# Patient Record
Sex: Female | Born: 1985 | Race: White | Hispanic: No | Marital: Single | Smoking: Current every day smoker
Health system: Southern US, Community
[De-identification: ages and names within clinical notes are randomized; demographics above are authoritative.]

## PROBLEM LIST (undated history)

## (undated) DIAGNOSIS — F418 Other specified anxiety disorders: Secondary | ICD-10-CM

## (undated) DIAGNOSIS — K458 Other specified abdominal hernia without obstruction or gangrene: Secondary | ICD-10-CM

## (undated) HISTORY — PX: TUBAL LIGATION: SHX77

## (undated) HISTORY — PX: ABDOMINAL HYSTERECTOMY: SHX81

---

## 2010-08-25 ENCOUNTER — Emergency Department (HOSPITAL_BASED_OUTPATIENT_CLINIC_OR_DEPARTMENT_OTHER)
Admission: EM | Admit: 2010-08-25 | Discharge: 2010-08-26 | Payer: Self-pay | Source: Home / Self Care | Admitting: Emergency Medicine

## 2010-11-14 LAB — URINE MICROSCOPIC-ADD ON

## 2010-11-14 LAB — URINALYSIS, ROUTINE W REFLEX MICROSCOPIC
Bilirubin Urine: NEGATIVE
Ketones, ur: NEGATIVE mg/dL
Nitrite: NEGATIVE
Protein, ur: NEGATIVE mg/dL
pH: 5.5 (ref 5.0–8.0)

## 2010-11-14 LAB — RAPID STREP SCREEN (MED CTR MEBANE ONLY): Streptococcus, Group A Screen (Direct): NEGATIVE

## 2010-11-14 LAB — PREGNANCY, URINE: Preg Test, Ur: NEGATIVE

## 2010-11-14 LAB — POCT TOXICOLOGY PANEL
Benzodiazepines: POSITIVE
Opiates: POSITIVE

## 2012-06-05 ENCOUNTER — Emergency Department (HOSPITAL_BASED_OUTPATIENT_CLINIC_OR_DEPARTMENT_OTHER)
Admission: EM | Admit: 2012-06-05 | Discharge: 2012-06-05 | Discharge status: Against Medical Advice | Payer: Self-pay | Attending: Emergency Medicine | Admitting: Emergency Medicine

## 2012-06-05 ENCOUNTER — Emergency Department (HOSPITAL_BASED_OUTPATIENT_CLINIC_OR_DEPARTMENT_OTHER): Payer: Self-pay

## 2012-06-05 ENCOUNTER — Encounter (HOSPITAL_BASED_OUTPATIENT_CLINIC_OR_DEPARTMENT_OTHER): Payer: Self-pay | Admitting: *Deleted

## 2012-06-05 DIAGNOSIS — F1910 Other psychoactive substance abuse, uncomplicated: Secondary | ICD-10-CM

## 2012-06-05 DIAGNOSIS — W19XXXA Unspecified fall, initial encounter: Secondary | ICD-10-CM

## 2012-06-05 DIAGNOSIS — W108XXA Fall (on) (from) other stairs and steps, initial encounter: Secondary | ICD-10-CM | POA: Insufficient documentation

## 2012-06-05 DIAGNOSIS — M25579 Pain in unspecified ankle and joints of unspecified foot: Secondary | ICD-10-CM | POA: Insufficient documentation

## 2012-06-05 DIAGNOSIS — Y92009 Unspecified place in unspecified non-institutional (private) residence as the place of occurrence of the external cause: Secondary | ICD-10-CM | POA: Insufficient documentation

## 2012-06-05 HISTORY — DX: Other specified anxiety disorders: F41.8

## 2012-06-05 HISTORY — DX: Other specified abdominal hernia without obstruction or gangrene: K45.8

## 2012-06-05 LAB — URINALYSIS, ROUTINE W REFLEX MICROSCOPIC
Bilirubin Urine: NEGATIVE
Ketones, ur: NEGATIVE mg/dL
Nitrite: POSITIVE — AB
Urobilinogen, UA: 0.2 mg/dL (ref 0.0–1.0)

## 2012-06-05 LAB — RAPID URINE DRUG SCREEN, HOSP PERFORMED
Amphetamines: NOT DETECTED
Barbiturates: NOT DETECTED
Benzodiazepines: POSITIVE — AB
Cocaine: POSITIVE — AB
Opiates: POSITIVE — AB
Tetrahydrocannabinol: POSITIVE — AB

## 2012-06-05 LAB — URINE MICROSCOPIC-ADD ON

## 2012-06-05 MED ORDER — KETOROLAC TROMETHAMINE 60 MG/2ML IM SOLN
60.0000 mg | Freq: Once | INTRAMUSCULAR | Status: AC
Start: 2012-06-05 — End: 2012-06-05
  Administered 2012-06-05: 60 mg via INTRAMUSCULAR
  Filled 2012-06-05: qty 2
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
## 2012-06-05 NOTE — ED Notes (Signed)
Patient was angry, cussing and hitting at EDP while discussing lab and xray findings.  Patient was able to dress self and walked out without difficulty.
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
## 2012-06-05 NOTE — ED Notes (Signed)
After triaging the pt in the room, adjusted bed to level of comfort per pt., SR up x 2.   Upon reentering room with EDP, the bed was in a lower position and light in room was turned off.

## 2012-07-06 ENCOUNTER — Emergency Department (HOSPITAL_BASED_OUTPATIENT_CLINIC_OR_DEPARTMENT_OTHER)
Admission: EM | Admit: 2012-07-06 | Discharge: 2012-07-06 | Disposition: A | Discharge status: Home / Self Care | Payer: Self-pay | Attending: Emergency Medicine | Admitting: Emergency Medicine

## 2012-07-06 ENCOUNTER — Encounter (HOSPITAL_BASED_OUTPATIENT_CLINIC_OR_DEPARTMENT_OTHER): Payer: Self-pay | Admitting: *Deleted

## 2012-07-06 DIAGNOSIS — M791 Myalgia, unspecified site: Secondary | ICD-10-CM

## 2012-07-06 DIAGNOSIS — Y939 Activity, unspecified: Secondary | ICD-10-CM | POA: Insufficient documentation

## 2012-07-06 DIAGNOSIS — W1789XA Other fall from one level to another, initial encounter: Secondary | ICD-10-CM | POA: Insufficient documentation

## 2012-07-06 DIAGNOSIS — F3289 Other specified depressive episodes: Secondary | ICD-10-CM | POA: Insufficient documentation

## 2012-07-06 DIAGNOSIS — F329 Major depressive disorder, single episode, unspecified: Secondary | ICD-10-CM | POA: Insufficient documentation

## 2012-07-06 DIAGNOSIS — Y929 Unspecified place or not applicable: Secondary | ICD-10-CM | POA: Insufficient documentation

## 2012-07-06 DIAGNOSIS — F411 Generalized anxiety disorder: Secondary | ICD-10-CM | POA: Insufficient documentation

## 2012-07-06 DIAGNOSIS — F17200 Nicotine dependence, unspecified, uncomplicated: Secondary | ICD-10-CM | POA: Insufficient documentation

## 2012-07-06 DIAGNOSIS — IMO0001 Reserved for inherently not codable concepts without codable children: Secondary | ICD-10-CM | POA: Insufficient documentation

## 2012-07-06 LAB — URINALYSIS, ROUTINE W REFLEX MICROSCOPIC
Glucose, UA: NEGATIVE mg/dL
Hgb urine dipstick: NEGATIVE
Specific Gravity, Urine: 1.023 (ref 1.005–1.030)
pH: 5 (ref 5.0–8.0)

## 2012-07-06 LAB — PREGNANCY, URINE: Preg Test, Ur: NEGATIVE

## 2012-07-06 LAB — RAPID URINE DRUG SCREEN, HOSP PERFORMED
Amphetamines: NOT DETECTED
Benzodiazepines: POSITIVE — AB
Cocaine: NOT DETECTED
Opiates: NOT DETECTED

## 2012-07-06 LAB — URINE MICROSCOPIC-ADD ON

## 2012-07-06 MED ORDER — KETOROLAC TROMETHAMINE 60 MG/2ML IM SOLN
60.0000 mg | Freq: Once | INTRAMUSCULAR | Status: AC
  Administered 2012-07-06: 60 mg via INTRAMUSCULAR
  Filled 2012-07-06: qty 2

## 2012-07-06 NOTE — ED Notes (Signed)
Pt crying and requesting something for pain.
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
## 2012-07-06 NOTE — ED Notes (Signed)
MD at bedside. Langston Masker at bedside with security.
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
## 2014-07-21 ENCOUNTER — Encounter (HOSPITAL_COMMUNITY): Payer: Self-pay | Admitting: *Deleted

## 2014-07-21 ENCOUNTER — Emergency Department (HOSPITAL_COMMUNITY)
Admission: EM | Admit: 2014-07-21 | Discharge: 2014-07-21 | Disposition: A | Discharge status: Home / Self Care | Payer: Self-pay | Attending: Emergency Medicine | Admitting: Emergency Medicine

## 2014-07-21 DIAGNOSIS — X58XXXA Exposure to other specified factors, initial encounter: Secondary | ICD-10-CM | POA: Insufficient documentation

## 2014-07-21 DIAGNOSIS — Y9389 Activity, other specified: Secondary | ICD-10-CM | POA: Insufficient documentation

## 2014-07-21 DIAGNOSIS — S0001XA Abrasion of scalp, initial encounter: Secondary | ICD-10-CM | POA: Insufficient documentation

## 2014-07-21 DIAGNOSIS — R590 Localized enlarged lymph nodes: Secondary | ICD-10-CM | POA: Insufficient documentation

## 2014-07-21 DIAGNOSIS — Z720 Tobacco use: Secondary | ICD-10-CM | POA: Insufficient documentation

## 2014-07-21 DIAGNOSIS — Z8719 Personal history of other diseases of the digestive system: Secondary | ICD-10-CM | POA: Insufficient documentation

## 2014-07-21 DIAGNOSIS — Y9289 Other specified places as the place of occurrence of the external cause: Secondary | ICD-10-CM | POA: Insufficient documentation

## 2014-07-21 DIAGNOSIS — L089 Local infection of the skin and subcutaneous tissue, unspecified: Secondary | ICD-10-CM | POA: Insufficient documentation

## 2014-07-21 DIAGNOSIS — Z8659 Personal history of other mental and behavioral disorders: Secondary | ICD-10-CM | POA: Insufficient documentation

## 2014-07-21 DIAGNOSIS — Y998 Other external cause status: Secondary | ICD-10-CM | POA: Insufficient documentation

## 2014-07-21 MED ORDER — CEPHALEXIN 500 MG PO CAPS: 500.0000 mg | ORAL_CAPSULE | Freq: Four times a day (QID) | ORAL | Status: DC

## 2014-07-21 NOTE — ED Notes (Signed)
Pt reports around 0200 pt started noticing several bumps behind left ear. Pt has noticeable areas in hair and behind ear. Pain 8/10.

## 2014-07-21 NOTE — Discharge Instructions (Signed)
Take antibiotic to completion. Apply warm compresses.  Lymphadenopathy Lymphadenopathy means "disease of the lymph glands." But the term is usually used to describe swollen or enlarged lymph glands, also called lymph nodes. These are the bean-shaped organs found in many locations including the neck, underarm, and groin. Lymph glands are part of the immune system, which fights infections in your body. Lymphadenopathy can occur in just one area of the body, such as the neck, or it can be generalized, with lymph node enlargement in several areas. The nodes found in the neck are the most common sites of lymphadenopathy. CAUSES When your immune system responds to germs (such as viruses or bacteria ), infection-fighting cells and fluid build up. This causes the glands to grow in size. Usually, this is not something to worry about. Sometimes, the glands themselves can become infected and inflamed. This is called lymphadenitis. Enlarged lymph nodes can be caused by many diseases:  Bacterial disease, such as strep throat or a skin infection.  Viral disease, such as a common cold.  Other germs, such as Lyme disease, tuberculosis, or sexually transmitted diseases.  Cancers, such as lymphoma (cancer of the lymphatic system) or leukemia (cancer of the white blood cells).  Inflammatory diseases such as lupus or rheumatoid arthritis.  Reactions to medications. Many of the diseases above are rare, but important. This is why you should see your caregiver if you have lymphadenopathy. SYMPTOMS  Swollen, enlarged lumps in the neck, back of the head, or other locations.  Tenderness.  Warmth or redness of the skin over the lymph nodes.  Fever. DIAGNOSIS Enlarged lymph nodes are often near the source of infection. They can help health care providers diagnose your illness. For instance:  Swollen lymph nodes around the jaw might be caused by an infection in the mouth.  Enlarged glands in the neck often  signal a throat infection.  Lymph nodes that are swollen in more than one area often indicate an illness caused by a virus. Your caregiver will likely know what is causing your lymphadenopathy after listening to your history and examining you. Blood tests, x-rays, or other tests may be needed. If the cause of the enlarged lymph node cannot be found, and it does not go away by itself, then a biopsy may be needed. Your caregiver will discuss this with you. TREATMENT Treatment for your enlarged lymph nodes will depend on the cause. Many times the nodes will shrink to normal size by themselves, with no treatment. Antibiotics or other medicines may be needed for infection. Only take over-the-counter or prescription medicines for pain, discomfort, or fever as directed by your caregiver. HOME CARE INSTRUCTIONS Swollen lymph glands usually return to normal when the underlying medical condition goes away. If they persist, contact your health-care provider. He/she might prescribe antibiotics or other treatments, depending on the diagnosis. Take any medications exactly as prescribed. Keep any follow-up appointments made to check on the condition of your enlarged nodes. SEEK MEDICAL CARE IF:  Swelling lasts for more than two weeks.  You have symptoms such as weight loss, night sweats, fatigue, or fever that does not go away.  The lymph nodes are hard, seem fixed to the skin, or are growing rapidly.  Skin over the lymph nodes is red and inflamed. This could mean there is an infection. SEEK IMMEDIATE MEDICAL CARE IF:  Fluid starts leaking from the area of the enlarged lymph node.  You develop a fever of 102 F (38.9 C) or greater.  Severe pain  develops (not necessarily at the site of a large lymph node).  You develop chest pain or shortness of breath.  You develop worsening abdominal pain. MAKE SURE YOU:  Understand these instructions.  Will watch your condition.  Will get help right away if you  are not doing well or get worse. Document Released: 05/30/2008 Document Revised: 01/05/2014 Document Reviewed: 05/30/2008 [facility] Patient Information 2015 [place], [place]. This information is not intended to replace advice given to you by your health care provider. Make sure you discuss any questions you have with your health care provider. Wound Infection A wound infection happens when a type of germ (bacteria) starts growing in the wound. In some cases, this can cause the wound to break open. If cared for properly, the infected wound will heal from the inside to the outside. Wound infections need treatment. CAUSES An infection is caused by bacteria growing in the wound.  SYMPTOMS   Increase in redness, swelling, or pain at the wound site.  Increase in drainage at the wound site.  Wound or bandage (dressing) starts to smell bad.  Fever.  Feeling tired or fatigued.  Pus draining from the wound. TREATMENT  Your health care provider will prescribe antibiotic medicine. The wound infection should improve within 24 to 48 hours. Any redness around the wound should stop spreading and the wound should be less painful.  HOME CARE INSTRUCTIONS   Only take over-the-counter or prescription medicines for pain, discomfort, or fever as directed by your health care provider.  Take your antibiotics as directed. Finish them even if you start to feel better.  Gently wash the area with mild soap and water 2 times a day, or as directed. Rinse off the soap. Pat the area dry with a clean towel. Do not rub the wound. This may cause bleeding.  Follow your health care provider's instructions for how often you need to change the dressing.  Apply ointment and a dressing to the wound as directed.  If the dressing sticks, moisten it with soapy water and gently remove it.  Change the bandage right away if it becomes wet, dirty, or develops a bad smell.  Take showers. Do not take tub baths, swim, or do  anything that may soak the wound until it is healed.  Avoid exercises that make you sweat heavily.  Use anti-itch medicine as directed by your health care provider. The wound may itch when it is healing. Do not pick or scratch at the wound.  Follow up with your health care provider to get your wound rechecked as directed. SEEK MEDICAL CARE IF:  You have an increase in swelling, pain, or redness around the wound.  You have an increase in the amount of pus coming from the wound.  There is a bad smell coming from the wound.  More of the wound breaks open.  You have a fever. MAKE SURE YOU:   Understand these instructions.  Will watch your condition.  Will get help right away if you are not doing well or get worse. Document Released: 05/20/2003 Document Revised: 08/26/2013 Document Reviewed: 12/25/2010 [facility]Exit[facility] Patient Information 2015 [place], [place]. This information is not intended to replace advice given to you by your health care provider. Make sure you discuss any questions you have with your health care provider.

## 2014-07-21 NOTE — ED Provider Notes (Signed)
CSN: [id number]     Arrival date & time 07/21/14  1330 History   This chart was scribed for non-physician practitioner, Celene Skeenobyn Davi Rotan, PA-C working with Toy CookeyMegan Docherty, MD by Freida Busmaniana Omoyeni, ED Scribe. This patient was seen in room WTR8/WTR8 and the patient's care was started at 4:00 PM.    Chief Complaint  Patient presents with  . Otalgia     HPI   HPI Comments:  Rhonda Salas is a 28 y.o. female who presents to the Emergency Department complaining of  bumps behind her left ear with moderate constant pain to the area that she noticed around 2 am. She notes similar bumps to her scalp. She has been scratching the area. She reports about one week ago there was a scab in the back of her head, which she has been picking off because she thought this would make the scab go away. No alleviating factors noted. Denies fever.   Past Medical History  Diagnosis Date  . Hernia, perineal   . Depression with anxiety    Past Surgical History  Procedure Laterality Date  . Abdominal hysterectomy    . Tubal ligation     History reviewed. No pertinent family history. History  Substance Use Topics  . Smoking status: Current Every Day Smoker -- 1.00 packs/day    Types: Cigarettes  . Smokeless tobacco: Not on file  . Alcohol Use: No   OB History    No data available     Review of Systems  10 Systems reviewed and are negative for acute change except as noted in the HPI.   Allergies  Review of patient's allergies indicates no known allergies.  Home Medications   Prior to Admission medications   Medication Sig Start Date End Date Taking? Authorizing Provider  acetaminophen (TYLENOL) 500 MG tablet Take 1,000 mg by mouth every 6 (six) hours as needed for moderate pain.   Yes Historical Provider, MD  cephALEXin (KEFLEX) 500 MG capsule Take 1 capsule (500 mg total) by mouth 4 (four) times daily. 07/21/14   Kayceon Oki M Arria Naim, PA-C   BP 103/55 mmHg  Pulse 74  Temp(Src) 97.8 F (36.6 C) (Oral)  Resp  16  SpO2 97% Physical Exam  Constitutional: She is oriented to person, place, and time. She appears well-developed and well-nourished. No distress.  HENT:  Head: Normocephalic and atraumatic.    Mouth/Throat: Oropharynx is clear and moist.  Eyes: Conjunctivae and EOM are normal.  Neck: Normal range of motion. Neck supple.  Cardiovascular: Normal rate, regular rhythm and normal heart sounds.   Pulmonary/Chest: Effort normal and breath sounds normal. No respiratory distress.  Musculoskeletal: Normal range of motion. She exhibits no edema.  Lymphadenopathy:       Head (left side): Posterior auricular (tender) adenopathy present.  Neurological: She is alert and oriented to person, place, and time. No sensory deficit.  Skin: Skin is warm and dry.  Psychiatric: She has a normal mood and affect. Her behavior is normal.  Nursing note and vitals reviewed.   ED Course  Procedures   DIAGNOSTIC STUDIES:  Oxygen Saturation is 97% on RA, normal by my interpretation.    COORDINATION OF CARE:  4:00 PM Discussed treatment plan with pt at bedside and pt agreed to plan.  Labs Review Labs Reviewed - No data to display  Imaging Review No results found.   EKG Interpretation None      MDM   Final diagnoses:  Abrasion, scalp with infection, initial encounter  Postauricular adenopathy  Patient with an infected scab to the back of her head with associated lymphadenopathy. Treat with antibiotics. Advised warm soaks. Stable for discharge. Return precautions given. Patient states understanding of treatment care plan and is agreeable.  I personally performed the services described in this documentation, which was scribed in my presence. The recorded information has been reviewed and is accurate.    Kathrynn SpeedRobyn M Ariyana Faw, PA-C 07/21/14 1603  Toy CookeyMegan Docherty, MD 07/22/14 [phone]

## 2016-01-24 ENCOUNTER — Encounter (HOSPITAL_BASED_OUTPATIENT_CLINIC_OR_DEPARTMENT_OTHER): Payer: Self-pay | Admitting: *Deleted

## 2016-01-24 ENCOUNTER — Emergency Department (HOSPITAL_BASED_OUTPATIENT_CLINIC_OR_DEPARTMENT_OTHER)
Admission: EM | Admit: 2016-01-24 | Discharge: 2016-01-24 | Disposition: A | Discharge status: Home / Self Care | Payer: Self-pay | Attending: Emergency Medicine | Admitting: Emergency Medicine

## 2016-01-24 DIAGNOSIS — F17210 Nicotine dependence, cigarettes, uncomplicated: Secondary | ICD-10-CM | POA: Insufficient documentation

## 2016-01-24 DIAGNOSIS — F329 Major depressive disorder, single episode, unspecified: Secondary | ICD-10-CM | POA: Insufficient documentation

## 2016-01-24 DIAGNOSIS — R590 Localized enlarged lymph nodes: Secondary | ICD-10-CM | POA: Insufficient documentation

## 2016-01-24 DIAGNOSIS — K029 Dental caries, unspecified: Secondary | ICD-10-CM | POA: Insufficient documentation

## 2016-01-24 MED ORDER — PENICILLIN V POTASSIUM 500 MG PO TABS: 500.0000 mg | ORAL_TABLET | Freq: Four times a day (QID) | ORAL | Status: AC

## 2016-01-24 MED ORDER — NAPROXEN 250 MG PO TABS
500.0000 mg | ORAL_TABLET | Freq: Once | ORAL | Status: AC
  Administered 2016-01-24: 500 mg via ORAL

## 2016-01-24 MED ORDER — NAPROXEN 250 MG PO TABS
ORAL_TABLET | ORAL | Status: AC
  Filled 2016-01-24: qty 2

## 2016-01-24 MED ORDER — PENICILLIN V POTASSIUM 250 MG PO TABS
500.0000 mg | ORAL_TABLET | Freq: Once | ORAL | Status: AC
  Administered 2016-01-24: 500 mg via ORAL
  Filled 2016-01-24: qty 2

## 2016-01-24 MED ORDER — HYDROCODONE-ACETAMINOPHEN 5-325 MG PO TABS: 1.0000 | ORAL_TABLET | ORAL | Status: AC | PRN

## 2016-01-24 MED ORDER — BUPIVACAINE-EPINEPHRINE (PF) 0.5% -1:200000 IJ SOLN
1.8000 mL | Freq: Once | INTRAMUSCULAR | Status: AC
  Administered 2016-01-24: 1.8 mL
  Filled 2016-01-24: qty 1.8

## 2016-01-24 NOTE — ED Notes (Signed)
Dr. Read DriversMolpus [facility] room[facility]

## 2016-01-24 NOTE — ED Provider Notes (Signed)
CSN: [id number]     Arrival date & time 01/24/16  0436 History   First MD Initiated Contact with Patient 01/24/16 [phone]     Chief Complaint  Patient presents with  . Dental Pain     (Consider location/radiation/quality/duration/timing/severity/associated sxs/prior Treatment) HPI This is a 30 year old female with pain in her left lower first molar for 2 days now. That molar has been carious for a long time. The pain is sharp and constant. She describes it as severe and radiates to the left side of her face. It is worse with movement of the jaw, palpation of the face or eating or drinking. There is associated left anterior cervical lymphadenopathy.  Past Medical History  Diagnosis Date  . Hernia, perineal   . Depression with anxiety    Past Surgical History  Procedure Laterality Date  . Abdominal hysterectomy    . Tubal ligation     History reviewed. No pertinent family history. Social History  Substance Use Topics  . Smoking status: Current Every Day Smoker -- 1.00 packs/day    Types: Cigarettes  . Smokeless tobacco: None  . Alcohol Use: No   OB History    No data available     Review of Systems  All other systems reviewed and are negative.   Allergies  Review of patient's allergies indicates no known allergies.  Home Medications   Prior to Admission medications   Medication Sig Start Date End Date Taking? Authorizing Provider  acetaminophen (TYLENOL) 500 MG tablet Take 1,000 mg by mouth every 6 (six) hours as needed for moderate pain.    Historical Provider, MD  cephALEXin (KEFLEX) 500 MG capsule Take 1 capsule (500 mg total) by mouth 4 (four) times daily. 07/21/14   Robyn M Hess, PA-C   BP 131/84 mmHg  Pulse 87  Temp(Src) 98.3 F (36.8 C) (Oral)  Resp 18  Ht 5\' 1"  (1.549 m)  Wt 140 lb (63.504 kg)  BMI 26.47 kg/m2  SpO2 100%   Physical Exam  General: Well-developed, well-nourished female in no acute distress; appearance consistent with age of record HENT:  normocephalic; atraumatic; carious left lower first molar with tenderness to percussion; tenderness of the left side of the face without swelling, erythema or warmth Eyes: pupils equal, round and reactive to light; extraocular muscles intact Neck: supple; left anterior cervical adenopathy Heart: regular rate and rhythm Lungs: clear to auscultation bilaterally Abdomen: soft; nondistended Extremities: No deformity; full range of motion Neurologic: Awake, alert and oriented; motor function intact in all extremities and symmetric; no facial droop Skin: Warm and dry Psychiatric: Flat affect    ED Course  Procedures (including critical care time)  DENTAL BLOCK 1.8 milliliters of 0.5% bupivacaine with epinephrine were injected into the buccal fold adjacent to the left lower first molar. The patient tolerated this well and there were no immediate complications. Adequate analgesia was obtained.  MDM      Paula LibraJohn Shellia Hartl, MD 01/24/16 [phone]

## 2016-09-05 ENCOUNTER — Emergency Department (HOSPITAL_COMMUNITY): Admission: EM | Admit: 2016-09-05 | Discharge: 2016-09-06 | Discharge status: Against Medical Advice | Payer: Self-pay

## 2016-09-05 NOTE — ED Notes (Signed)
Pt called back for triage and pt asked for wait time and became agitated and states she is leaving, pt walked out of triage at this time.

## 2016-11-16 ENCOUNTER — Emergency Department: Admission: EM | Admit: 2016-11-16 | Discharge: 2016-11-16 | Discharge status: Against Medical Advice | Payer: Self-pay

## 2018-12-16 DIAGNOSIS — F1410 Cocaine abuse, uncomplicated: Secondary | ICD-10-CM | POA: Insufficient documentation

## 2018-12-16 DIAGNOSIS — F1310 Sedative, hypnotic or anxiolytic abuse, uncomplicated: Secondary | ICD-10-CM | POA: Insufficient documentation

## 2018-12-16 DIAGNOSIS — F1124 Opioid dependence with opioid-induced mood disorder: Secondary | ICD-10-CM | POA: Insufficient documentation

## 2018-12-16 DIAGNOSIS — F1510 Other stimulant abuse, uncomplicated: Secondary | ICD-10-CM | POA: Insufficient documentation

## 2019-03-25 ENCOUNTER — Encounter (HOSPITAL_BASED_OUTPATIENT_CLINIC_OR_DEPARTMENT_OTHER): Payer: Self-pay

## 2019-03-25 ENCOUNTER — Emergency Department (HOSPITAL_BASED_OUTPATIENT_CLINIC_OR_DEPARTMENT_OTHER): Payer: Self-pay

## 2019-03-25 ENCOUNTER — Other Ambulatory Visit: Payer: Self-pay

## 2019-03-25 ENCOUNTER — Emergency Department (HOSPITAL_BASED_OUTPATIENT_CLINIC_OR_DEPARTMENT_OTHER)
Admission: EM | Admit: 2019-03-25 | Discharge: 2019-03-26 | Discharge status: Against Medical Advice | Payer: Self-pay | Attending: Emergency Medicine | Admitting: Emergency Medicine

## 2019-03-25 DIAGNOSIS — F17210 Nicotine dependence, cigarettes, uncomplicated: Secondary | ICD-10-CM | POA: Insufficient documentation

## 2019-03-25 DIAGNOSIS — L089 Local infection of the skin and subcutaneous tissue, unspecified: Secondary | ICD-10-CM | POA: Insufficient documentation

## 2019-03-25 MED ORDER — VANCOMYCIN HCL IN DEXTROSE 1-5 GM/200ML-% IV SOLN
1000.0000 mg | Freq: Once | INTRAVENOUS | Status: AC
  Administered 2019-03-26: 1000 mg via INTRAVENOUS
  Filled 2019-03-25: qty 200

## 2019-03-25 MED ORDER — KETOROLAC TROMETHAMINE 30 MG/ML IJ SOLN
15.0000 mg | Freq: Once | INTRAMUSCULAR | Status: AC
  Administered 2019-03-26: 15 mg via INTRAVENOUS
  Filled 2019-03-25: qty 1

## 2019-03-25 MED ORDER — PIPERACILLIN-TAZOBACTAM 3.375 G IVPB 30 MIN
3.3750 g | Freq: Once | INTRAVENOUS | Status: AC
  Administered 2019-03-26: 3.375 g via INTRAVENOUS
  Filled 2019-03-25 (×2): qty 50

## 2019-03-25 NOTE — ED Triage Notes (Signed)
Pt c/o abscess to left buttock x 4 days-NAD-steady gait

## 2019-03-26 ENCOUNTER — Encounter (HOSPITAL_BASED_OUTPATIENT_CLINIC_OR_DEPARTMENT_OTHER): Payer: Self-pay | Admitting: Emergency Medicine

## 2019-03-26 LAB — CBC WITH DIFFERENTIAL/PLATELET
Abs Immature Granulocytes: 0.02 10*3/uL (ref 0.00–0.07)
Basophils Absolute: 0 10*3/uL (ref 0.0–0.1)
Basophils Relative: 0 %
Eosinophils Absolute: 0.2 10*3/uL (ref 0.0–0.5)
Eosinophils Relative: 3 %
HCT: 38.5 % (ref 36.0–46.0)
Hemoglobin: 12.5 g/dL (ref 12.0–15.0)
Immature Granulocytes: 0 %
Lymphocytes Relative: 28 %
Lymphs Abs: 1.7 10*3/uL (ref 0.7–4.0)
MCH: 29.3 pg (ref 26.0–34.0)
MCHC: 32.5 g/dL (ref 30.0–36.0)
MCV: 90.4 fL (ref 80.0–100.0)
Monocytes Absolute: 0.5 10*3/uL (ref 0.1–1.0)
Monocytes Relative: 7 %
Neutro Abs: 3.7 10*3/uL (ref 1.7–7.7)
Neutrophils Relative %: 62 %
Platelets: 261 10*3/uL (ref 150–400)
RBC: 4.26 MIL/uL (ref 3.87–5.11)
RDW: 12.9 % (ref 11.5–15.5)
WBC: 6.1 10*3/uL (ref 4.0–10.5)
nRBC: 0 % (ref 0.0–0.2)

## 2019-03-26 LAB — BASIC METABOLIC PANEL
Anion gap: 12 (ref 5–15)
BUN: 13 mg/dL (ref 6–20)
CO2: 25 mmol/L (ref 22–32)
Calcium: 9.1 mg/dL (ref 8.9–10.3)
Chloride: 101 mmol/L (ref 98–111)
Creatinine, Ser: 0.62 mg/dL (ref 0.44–1.00)
GFR calc Af Amer: >60 mL/min (ref >60)
GFR calc non Af Amer: >60 mL/min (ref >60)
Glucose, Bld: 113 mg/dL — ABNORMAL HIGH (ref 70–99)
Potassium: 3.7 mmol/L (ref 3.5–5.1)
Sodium: 138 mmol/L (ref 135–145)

## 2019-03-26 MED ORDER — IOHEXOL 300 MG/ML  SOLN
100.0000 mL | Freq: Once | INTRAMUSCULAR | Status: AC | PRN
  Administered 2019-03-26: 100 mL via INTRAVENOUS

## 2019-03-26 MED ORDER — CLINDAMYCIN HCL 300 MG PO CAPS: 300.0000 mg | ORAL_CAPSULE | Freq: Four times a day (QID) | ORAL | 0 refills | Status: AC

## 2019-03-26 NOTE — ED Notes (Signed)
Rn attempted to start IV without success.

## 2019-03-26 NOTE — ED Notes (Signed)
Patient transported to CT 
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
## 2019-11-11 ENCOUNTER — Emergency Department (HOSPITAL_COMMUNITY)
Admission: EM | Admit: 2019-11-11 | Discharge: 2019-11-11 | Disposition: A | Discharge status: Home / Self Care | Payer: Self-pay | Attending: Emergency Medicine | Admitting: Emergency Medicine

## 2019-11-11 ENCOUNTER — Encounter (HOSPITAL_COMMUNITY): Payer: Self-pay | Admitting: Emergency Medicine

## 2019-11-11 ENCOUNTER — Other Ambulatory Visit: Payer: Self-pay

## 2019-11-11 DIAGNOSIS — S8010XA Contusion of unspecified lower leg, initial encounter: Secondary | ICD-10-CM

## 2019-11-11 DIAGNOSIS — S80812A Abrasion, left lower leg, initial encounter: Secondary | ICD-10-CM | POA: Insufficient documentation

## 2019-11-11 DIAGNOSIS — F17210 Nicotine dependence, cigarettes, uncomplicated: Secondary | ICD-10-CM | POA: Insufficient documentation

## 2019-11-11 DIAGNOSIS — S0101XA Laceration without foreign body of scalp, initial encounter: Secondary | ICD-10-CM

## 2019-11-11 DIAGNOSIS — S50812A Abrasion of left forearm, initial encounter: Secondary | ICD-10-CM | POA: Insufficient documentation

## 2019-11-11 DIAGNOSIS — Y929 Unspecified place or not applicable: Secondary | ICD-10-CM | POA: Insufficient documentation

## 2019-11-11 DIAGNOSIS — S0181XA Laceration without foreign body of other part of head, initial encounter: Secondary | ICD-10-CM

## 2019-11-11 DIAGNOSIS — S90812A Abrasion, left foot, initial encounter: Secondary | ICD-10-CM | POA: Insufficient documentation

## 2019-11-11 DIAGNOSIS — Y939 Activity, unspecified: Secondary | ICD-10-CM | POA: Insufficient documentation

## 2019-11-11 DIAGNOSIS — Y999 Unspecified external cause status: Secondary | ICD-10-CM | POA: Insufficient documentation

## 2019-11-11 MED ORDER — IBUPROFEN 800 MG PO TABS
800.0000 mg | ORAL_TABLET | Freq: Once | ORAL | Status: AC
  Administered 2019-11-11: 800 mg via ORAL
  Filled 2019-11-11: qty 1

## 2019-11-11 MED ORDER — ACETAMINOPHEN 325 MG PO TABS
650.0000 mg | ORAL_TABLET | Freq: Once | ORAL | Status: AC
  Administered 2019-11-11: 650 mg via ORAL
  Filled 2019-11-11: qty 2

## 2019-11-11 MED ORDER — NAPROXEN 250 MG PO TABS: 500.0000 mg | ORAL_TABLET | Freq: Once | ORAL | Status: DC
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
## 2019-11-11 NOTE — ED Provider Notes (Addendum)
LACERATION REPAIR Performed by: Placido Sou Consent: Verbal consent obtained. Risks and benefits: risks, benefits and alternatives were discussed Patient identity confirmed: provided demographic data Time out performed prior to procedure Prepped and Draped in normal sterile fashion Wound explored Laceration Location: right occipital scalp Laceration Length: 2 cm avulsion laceration No Foreign Bodies seen or palpated Irrigation method: syringe Amount of cleaning: standard Skin closure: wound well approximated with staples Number of sutures or staples: 2 Patient tolerance: Patient tolerated the procedure well with no immediate complications.    Placido Sou, PA-C 11/11/19 1126  LACERATION REPAIR Performed by: Placido Sou Consent: Verbal consent obtained. Risks and benefits: risks, benefits and alternatives were discussed Patient identity confirmed: provided demographic data Time out performed prior to procedure Prepped and Draped in normal sterile fashion Wound explored Laceration Location: left forehead Laceration Length: 0.5cm puncture wound No Foreign Bodies seen or palpated Amount of cleaning: standard Skin closure: dermabond  Patient tolerance: Patient tolerated the procedure well with no immediate complications.    Placido Sou, PA-C 11/11/19 1138    Margarita Grizzle, MD 11/12/19 1133

## 2019-11-11 NOTE — Discharge Instructions (Addendum)
Please use tylenol and ibuprofen for pain. Staples out in 5-7 days

## 2019-11-11 NOTE — ED Notes (Signed)
Wounds have been cleaned. PA at bedside.

## 2021-03-15 IMAGING — CT CT ABDOMEN AND PELVIS WITH CONTRAST
2 of 4 series · 15 of 46 positions shown, 17 images · IV contrast (APPLIED)
Comparison: None.

CLINICAL DATA: 33-year-old female with left buttock abscess x5
days.

EXAM:
CT ABDOMEN AND PELVIS WITH CONTRAST
TECHNIQUE: Multidetector CT imaging of the abdomen and pelvis was performed
using the standard protocol following bolus administration of
intravenous contrast.
CONTRAST:  100mL OMNIPAQUE IOHEXOL 300 MG/ML  SOLN

[Series 3: axial st prone · axial · 0.73mm/px · z∈[-207,+308]mm · 12 of 113 slices shown, 14 images]
[im 5/113  soft-tissue]
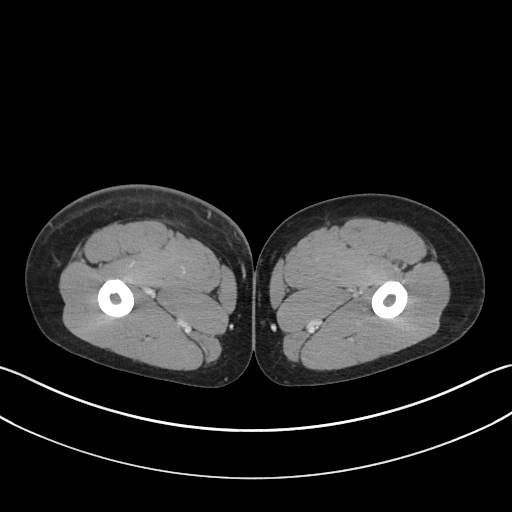
[im 5/113  bone]
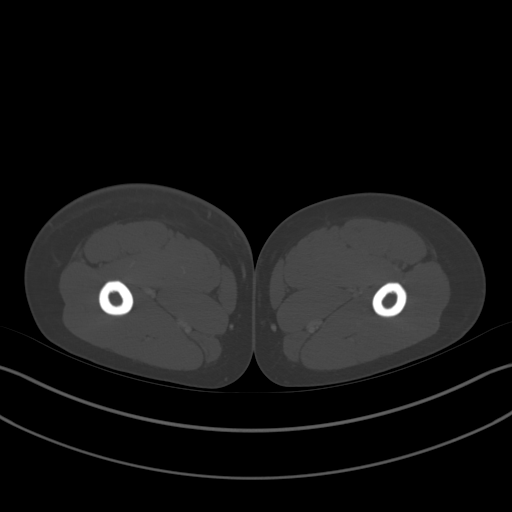
[im 15/113  soft-tissue]
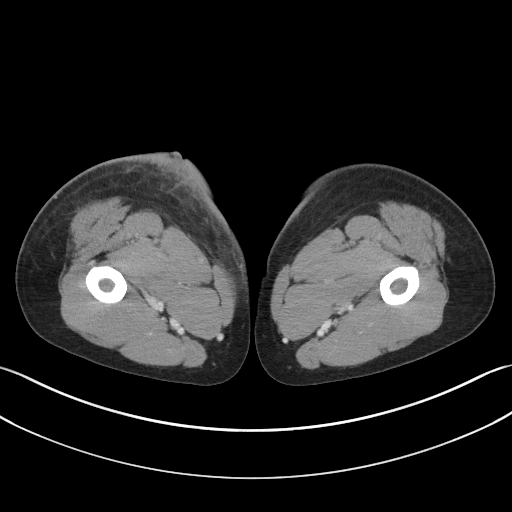
[im 25/113  soft-tissue]
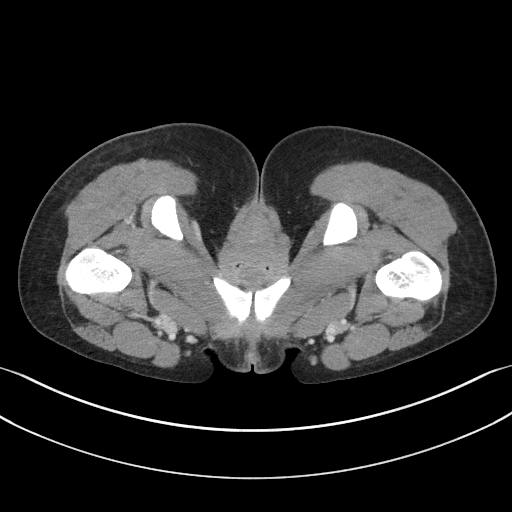
[im 35/113  soft-tissue]
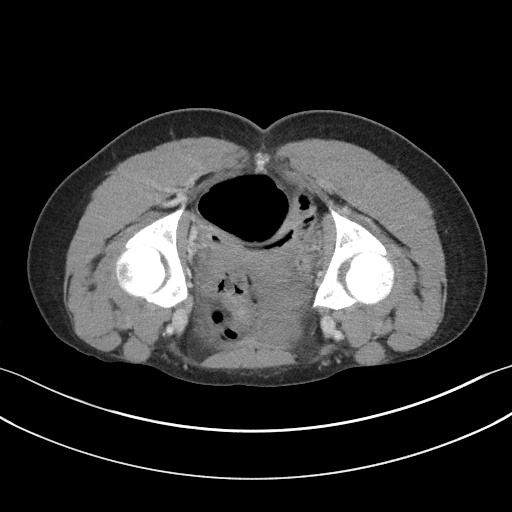
[im 44/113  soft-tissue]
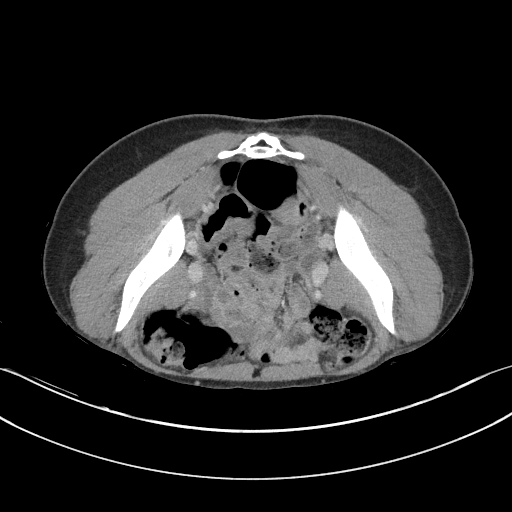
[im 54/113  soft-tissue]
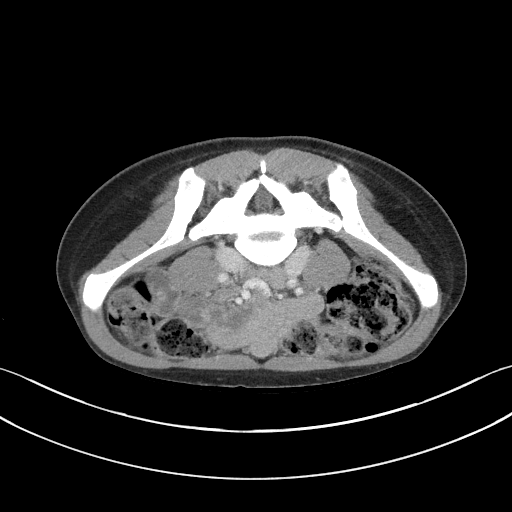
[im 59/113  soft-tissue]
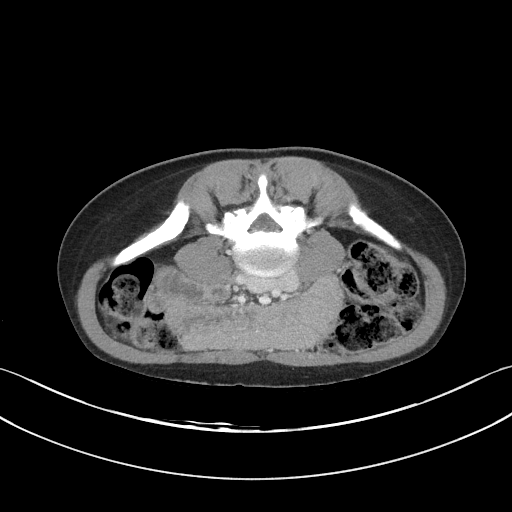
[im 69/113  soft-tissue]
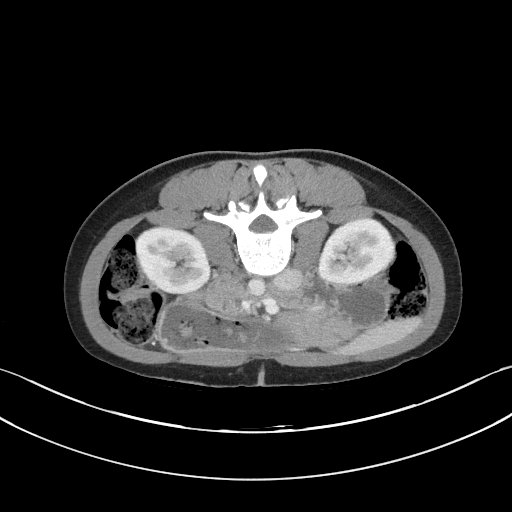
[im 78/113  soft-tissue]
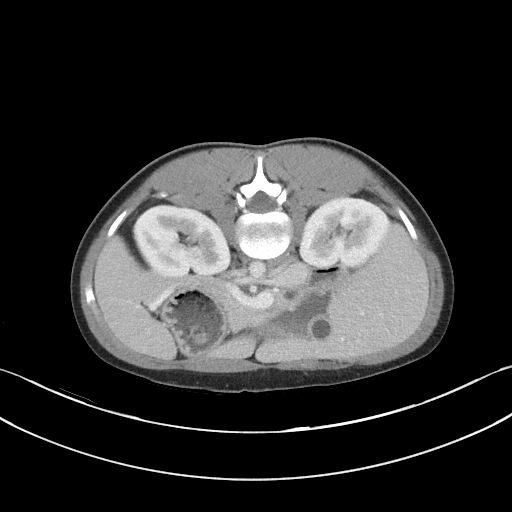
[im 78/113  bone]
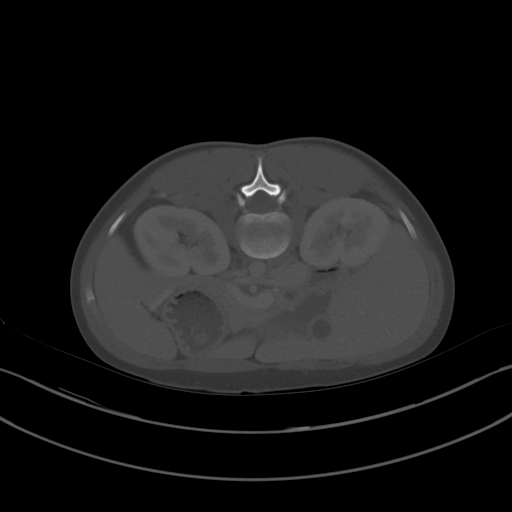
[im 88/113  soft-tissue]
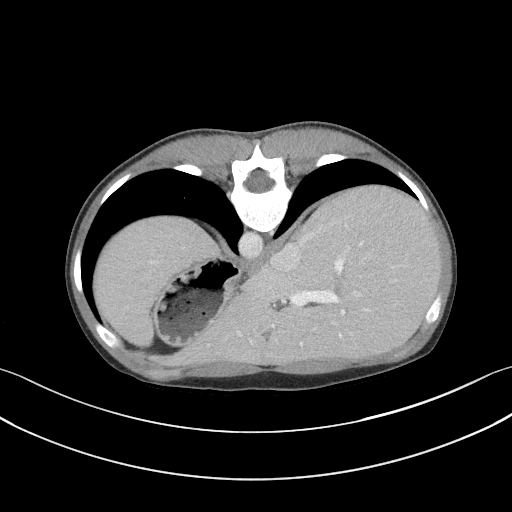
[im 98/113  soft-tissue]
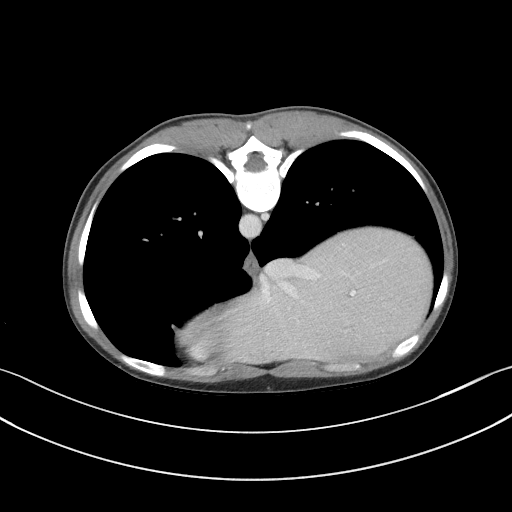
[im 108/113  soft-tissue]
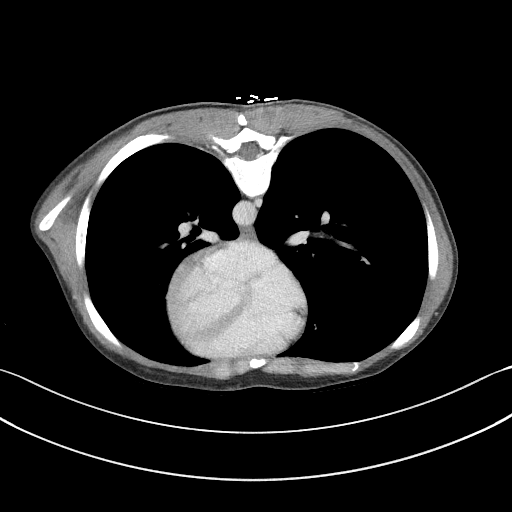

[Series 6: coronal st prone · coronal · 0.78mm/px · 3 of 71 slices shown]
[im 24/71  soft-tissue]
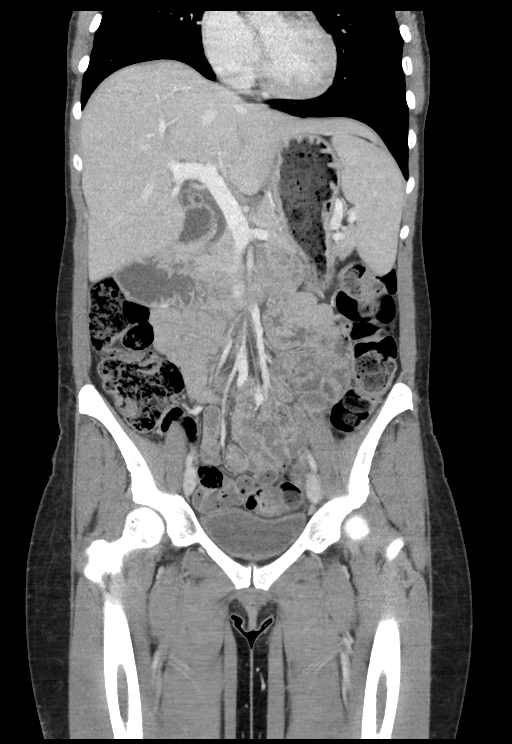
[im 32/71  soft-tissue]
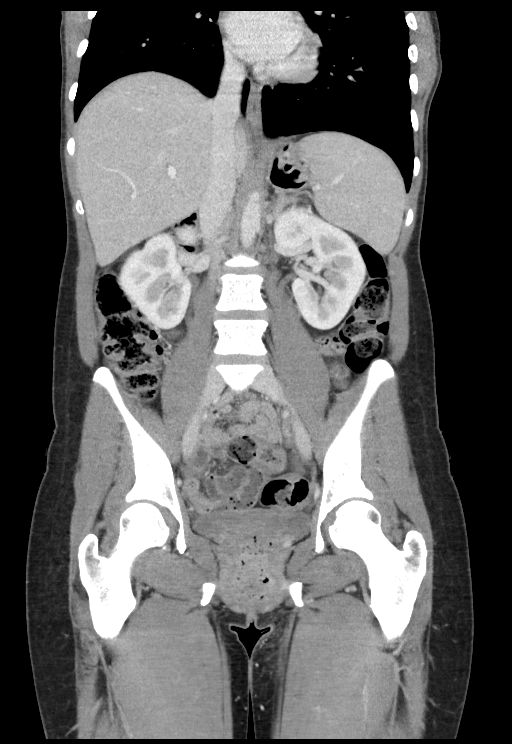
[im 39/71  soft-tissue]
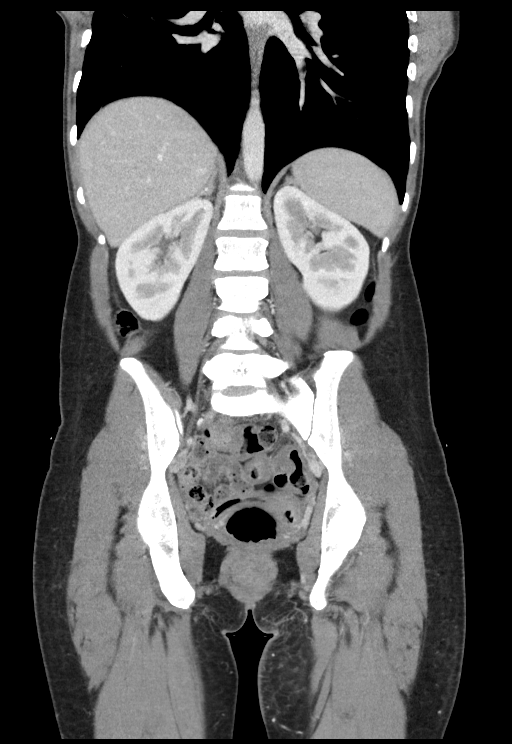

[15 of 46 positions shown; findings below may reference images not displayed]

FINDINGS: Lower chest: The visualized lung bases are clear.

No intra-abdominal free air or free fluid.

Hepatobiliary: The liver is unremarkable. Minimal dilatation of the
intrahepatic biliary trees versus mild periportal edema. No
calcified gallstone.

Pancreas: Unremarkable. No pancreatic ductal dilatation or
surrounding inflammatory changes.

Spleen: Normal in size without focal abnormality.

Adrenals/Urinary Tract: The adrenal glands are unremarkable. The
kidneys, visualized ureters, and urinary bladder appear unremarkable
as well.

Stomach/Bowel: There is moderate amount of stool throughout the
colon. No bowel obstruction or active inflammation. Normal appendix.

Vascular/Lymphatic: The abdominal aorta and IVC are unremarkable. No
portal venous gas. There is no adenopathy.

Reproductive: Hysterectomy. No pelvic mass.

Other: Induration of the skin and subcutaneous soft tissues of the
left posterior upper thigh and sub gluteal region. A small pocket of
air is noted in the superficial subcutaneous soft tissue deep to the
skin wound. No drainable fluid collection.

Musculoskeletal: No acute or significant osseous findings.
IMPRESSION: 1. Inflammatory changes of the skin and subcutaneous soft tissues of
the left posterior upper thigh/sub gluteal region surrounding the
open wound. Small pocket of air deep to the skin noted. No drainable
fluid collection.
2. No acute intra-abdominal or pelvic pathology.
3. Moderate colonic stool burden. No bowel obstruction or active
inflammation. Normal appendix.

## 2021-10-26 ENCOUNTER — Other Ambulatory Visit: Payer: Self-pay

## 2021-10-26 ENCOUNTER — Ambulatory Visit
Admission: EM | Admit: 2021-10-26 | Discharge: 2021-10-26 | Disposition: A | Discharge status: Home / Self Care | Payer: Self-pay

## 2021-10-26 DIAGNOSIS — R1012 Left upper quadrant pain: Secondary | ICD-10-CM

## 2021-10-26 MED ORDER — OMEPRAZOLE 20 MG PO CPDR: 20.0000 mg | DELAYED_RELEASE_CAPSULE | Freq: Every day | ORAL | 0 refills | Status: AC

## 2021-10-26 NOTE — ED Provider Notes (Signed)
[facility]    CSN: [id number] Arrival date & time: 10/26/21  1114      History   Chief Complaint Chief Complaint  Patient presents with   Abdominal Pain    HPI Rhonda Salas is a 36 y.o. female.   Patient here today for evaluation of left upper quadrant pain that has been present for the last few weeks. Symptoms have waxed and waned since onset. She reports pain is a dull aching pain that is worsened with eating. Not eating helps with pain. She has not had any nausea or vomiting. She has had some diarrhea and constipation but denies any blood in her stool. She denies fever.   The history is provided by the patient.  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      Home Medications    Prior to Admission medications   Medication Sig Start Date End Date Taking? Authorizing Provider  methadone (DOLOPHINE) 10 MG tablet Take 10 mg by mouth every 8 (eight) hours.   Yes [provider]  omeprazole (PRILOSEC) 20 MG capsule Take 1 capsule (20 mg total) by mouth daily. 10/26/21  Yes Tomi Bamberger, PA-C  clindamycin (CLEOCIN) 300 MG capsule Take 1 capsule (300 mg total) by mouth 4 (four) times daily. X 7 days Patient not taking: Reported on 11/11/2019 03/26/19   Palumbo, April, MD  HYDROcodone-acetaminophen (NORCO) 5-325 MG tablet Take 1 tablet by mouth every 4 (four) hours  as needed. Patient not taking: Reported on 11/11/2019 01/24/16   Molpus, Jonny Ruiz, MD    Family History History reviewed. No pertinent family history.  Social History Social History   Tobacco Use   Smoking status: Every Day    Packs/day: 1.00    Types: Cigarettes   Smokeless tobacco: Never  Substance Use Topics   Alcohol use: No   Drug use: Not Currently     Allergies   Patient has no known allergies.   Review of Systems Review of Systems  Constitutional:  Negative for chills and fever.  Eyes:  Negative for discharge and redness.  Gastrointestinal:  Positive for abdominal pain. Negative for nausea and vomiting.  Genitourinary:  Positive for vaginal bleeding and vaginal discharge.    Physical Exam Triage Vital Signs ED Triage Vitals [10/26/21 1128]  Enc Vitals Group     BP 107/68     Pulse Rate 75     Resp (!) 128     Temp 98.2 F (36.8 C)     Temp Source Oral     SpO2 98 %     Weight      Height      Head Circumference      Peak Flow      Pain Score 0     Pain Loc      Pain Edu?  Excl. in [place]?    No data found.  Updated Vital Signs BP 107/68 (BP Location: Left Arm)    Pulse 75    Temp 98.2 F (36.8 C) (Oral)    Resp 12    SpO2 98%      Physical Exam Vitals and nursing note reviewed.  Constitutional:      General: She is not in acute distress.    Appearance: Normal appearance. She is not ill-appearing.  HENT:     Head: Normocephalic and atraumatic.  Eyes:     Conjunctiva/sclera: Conjunctivae normal.  Cardiovascular:     Rate and Rhythm: Normal rate and regular rhythm.     Heart sounds: Normal heart sounds. No murmur heard. Pulmonary:     Effort: Pulmonary effort is normal. No respiratory distress.     Breath sounds: Normal breath sounds. No wheezing, rhonchi or rales.  Abdominal:     General: Abdomen is flat. Bowel sounds are normal. There is no distension.     Palpations: Abdomen is soft.     Tenderness: There is abdominal tenderness (mild TTP  to left abdomen diffusely). There is no guarding or rebound.  Neurological:     Mental Status: She is alert.  Psychiatric:        Mood and Affect: Mood normal.        Behavior: Behavior normal.        Thought Content: Thought content normal.     UC Treatments / Results  Labs (all labs ordered are listed, but only abnormal results are displayed) Labs Reviewed  CBC WITH DIFFERENTIAL/PLATELET  COMPREHENSIVE METABOLIC PANEL  LIPASE  H. PYLORI ANTIBODY, IGG    EKG   Radiology No results found.  Procedures Procedures (including critical care time)  Medications Ordered in UC Medications - No data to display  Initial Impression / Assessment and Plan / UC Course  I have reviewed the triage vital signs and the nursing notes.  Pertinent labs & imaging results that were available during my care of the patient were reviewed by me and considered in my medical decision making (see chart for details).    Labs ordered. Recommended she establish care with PCP as she may need further evaluation if labs are inconclusive. Will trial omeprazole to cover possible gastric ulcer. Encouraged follow up with any further concerns.   Final Clinical Impressions(s) / UC Diagnoses   Final diagnoses:  Left upper quadrant abdominal pain   Discharge Instructions   None    ED Prescriptions     Medication Sig Dispense Auth. Provider   omeprazole (PRILOSEC) 20 MG capsule Take 1 capsule (20 mg total) by mouth daily. 30 capsule Tomi Bamberger, PA-C      PDMP not reviewed this encounter.   Tomi Bamberger, PA-C 10/26/21 1210

## 2021-10-26 NOTE — ED Triage Notes (Signed)
Pt c/o dull and cramping abd pain started "a couple of weeks ago" spontaneously resolved and started again "a couple of days ago." Denies nausea, vomiting, states has had diarrhea. LBM yesterday.

## 2021-10-26 NOTE — ED Provider Notes (Incomplete)
[facility]    CSN: [id number] Arrival date & time: 10/26/21  1114      History   Chief Complaint No chief complaint on file.   HPI Rhonda Salas is a 36 y.o. female.   HPI  Past Medical History:  Diagnosis Date   Depression with anxiety    Hernia, perineal     Patient Active Problem List   Diagnosis Date Noted   Cocaine abuse ([place]) 12/16/2018   Methamphetamine abuse ([place]) 12/16/2018   Opioid dependence with opioid-induced mood disorder ([place]) 12/16/2018   Xanax use disorder, mild, abuse ([place]) 12/16/2018    Past Surgical History:  Procedure Laterality Date   ABDOMINAL HYSTERECTOMY     TUBAL LIGATION      OB History   No obstetric history on file.      Home Medications    Prior to Admission medications   Medication Sig Start Date End Date Taking? Authorizing Provider  clindamycin (CLEOCIN) 300 MG capsule Take 1 capsule (300 mg total) by mouth 4 (four) times daily. X 7 days Patient not taking: Reported on 11/11/2019 03/26/19   Palumbo, April, MD  HYDROcodone-acetaminophen (NORCO) 5-325 MG tablet Take 1 tablet by mouth every 4 (four) hours as needed. Patient not taking: Reported on 11/11/2019 01/24/16   Molpus, Jenny Reichmann, MD    Family History No family history on file.  Social History Social History   Tobacco Use   Smoking status: Every Day    Packs/day: 1.00    Types: Cigarettes   Smokeless tobacco: Never  Substance Use Topics   Alcohol use: No   Drug use: Not Currently     Allergies   Patient has no known allergies.   Review of Systems Review of Systems  Constitutional:  Negative for chills and fever.  Eyes:  Negative for discharge and redness.  Gastrointestinal:  Positive for abdominal pain. Negative for nausea and vomiting.    Physical Exam Triage Vital Signs ED Triage Vitals  Enc Vitals Group     BP      Pulse      Resp      Temp      Temp src      SpO2      Weight      Height      Head Circumference       Peak Flow      Pain Score      Pain Loc      Pain Edu?      Excl. in [place]?    No data found.  Updated Vital Signs There were no vitals taken for this visit.  Physical Exam Vitals and nursing note reviewed.  Constitutional:      General: She is not in acute distress.    Appearance: Normal appearance. She is not ill-appearing.  HENT:     Head: Normocephalic and atraumatic.  Eyes:     Conjunctiva/sclera: Conjunctivae normal.  Cardiovascular:     Rate and Rhythm: Normal rate.  Pulmonary:     Effort: Pulmonary effort is normal.  Neurological:     Mental Status: She is alert.  Psychiatric:        Mood and Affect: Mood normal.        Behavior: Behavior normal.        Thought Content: Thought content normal.     UC Treatments / Results  Labs (all labs ordered are listed, but only abnormal results are displayed) Labs Reviewed - No data to  display  EKG   Radiology No results found.  Procedures Procedures (including critical care time)  Medications Ordered in UC Medications - No data to display  Initial Impression / Assessment and Plan / UC Course  I have reviewed the triage vital signs and the nursing notes.  Pertinent labs & imaging results that were available during my care of the patient were reviewed by me and considered in my medical decision making (see chart for details).     *** Final Clinical Impressions(s) / UC Diagnoses   Final diagnoses:  None   Discharge Instructions   None    ED Prescriptions   None    PDMP not reviewed this encounter.

## 2021-10-26 NOTE — ED Notes (Signed)
RR 128 in error. Changed to 12 RR.

## 2022-06-07 ENCOUNTER — Ambulatory Visit
Admission: EM | Admit: 2022-06-07 | Discharge: 2022-06-07 | Disposition: A | Discharge status: Home / Self Care | Payer: Self-pay | Attending: Physician Assistant | Admitting: Physician Assistant

## 2022-06-07 DIAGNOSIS — K047 Periapical abscess without sinus: Secondary | ICD-10-CM

## 2022-06-07 MED ORDER — AMOXICILLIN 500 MG PO CAPS: 500.0000 mg | ORAL_CAPSULE | Freq: Three times a day (TID) | ORAL | 0 refills | Status: AC

## 2022-06-07 NOTE — ED Triage Notes (Signed)
Pt presents with right side dental pain X 1 week.

## 2022-06-07 NOTE — ED Provider Notes (Signed)
[facility]    CSN: [id number] Arrival date & time: 06/07/22  1626      History   Chief Complaint Chief Complaint  Patient presents with   Dental Pain    HPI Rhonda Salas is a 36 y.o. female.   Patient here today for evaluation of right-sided upper dental pain.  She reports that symptoms have been present for a week and are not improving.  She has been taking Tylenol and ibuprofen without significant relief.  She has had some nausea at times.  She denies any fever.  The history is provided by the patient.  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      Home Medications    Prior to Admission medications   Medication Sig Start Date End Date Taking? Authorizing Provider  amoxicillin (AMOXIL) 500 MG capsule Take 1 capsule (500 mg total) by mouth 3 (three) times daily. 06/07/22  Yes Tomi Bamberger, PA-C  clindamycin (CLEOCIN) 300 MG capsule Take 1 capsule (300 mg total) by mouth 4 (four) times daily. X 7 days Patient not taking: Reported on 11/11/2019 03/26/19   Palumbo, April, MD  HYDROcodone-acetaminophen (NORCO) 5-325 MG tablet Take 1 tablet by mouth every 4 (four) hours as needed. Patient not taking: Reported on 11/11/2019 01/24/16   Molpus, John, MD  methadone (DOLOPHINE) 10 MG tablet Take 10 mg by mouth every 8 (eight) hours.    [provider]  omeprazole (PRILOSEC) 20 MG capsule Take 1 capsule (20 mg total) by mouth daily. 10/26/21    Tomi Bamberger, PA-C    Family History Family History  Family history unknown: Yes    Social History Social History   Tobacco Use   Smoking status: Every Day    Packs/day: 1.00    Types: Cigarettes   Smokeless tobacco: Never  Substance Use Topics   Alcohol use: No   Drug use: Not Currently     Allergies   Patient has no known allergies.   Review of Systems Review of Systems  Constitutional:  Negative for chills and fever.  HENT:  Positive for dental problem. Negative for facial swelling.   Eyes:  Negative for discharge and redness.  Respiratory:  Negative for shortness of breath.   Gastrointestinal:  Positive for nausea. Negative for abdominal pain and vomiting.     Physical Exam Triage Vital Signs ED Triage Vitals  Enc Vitals Group     BP 06/07/22 1636 116/80     Pulse Rate 06/07/22 1636 86     Resp 06/07/22 1636 17     Temp 06/07/22 1636 98.3 F (36.8 C)     Temp Source 06/07/22 1636 Oral     SpO2 06/07/22 1636 97 %     Weight --      Height --      Head Circumference --      Peak Flow --  Pain Score 06/07/22 1635 9     Pain Loc --      Pain Edu? --      Excl. in [place]? --    No data found.  Updated Vital Signs BP 116/80 (BP Location: Left Arm)   Pulse 86   Temp 98.3 F (36.8 C) (Oral)   Resp 17   SpO2 97%     Physical Exam Vitals and nursing note reviewed.  Constitutional:      General: She is not in acute distress.    Appearance: Normal appearance. She is not ill-appearing.  HENT:     Head: Normocephalic and atraumatic.     Nose: Nose normal. No congestion or rhinorrhea.     Mouth/Throat:     Mouth: Mucous membranes are moist.     Comments: Poor dentition throughout with multiple caries, significant erythema and swelling to right upper posterior gumline diffusely Eyes:     Conjunctiva/sclera: Conjunctivae normal.  Cardiovascular:     Rate and Rhythm: Normal rate.  Pulmonary:     Effort: Pulmonary effort is normal.   Neurological:     Mental Status: She is alert.  Psychiatric:        Mood and Affect: Mood normal.        Behavior: Behavior normal.        Thought Content: Thought content normal.      UC Treatments / Results  Labs (all labs ordered are listed, but only abnormal results are displayed) Labs Reviewed - No data to display  EKG   Radiology No results found.  Procedures Procedures (including critical care time)  Medications Ordered in UC Medications - No data to display  Initial Impression / Assessment and Plan / UC Course  I have reviewed the triage vital signs and the nursing notes.  Pertinent labs & imaging results that were available during my care of the patient were reviewed by me and considered in my medical decision making (see chart for details).    Amoxicillin prescribed to cover dental abscess.  Recommended follow-up with dentistry as soon as possible.  Patient expresses understanding.  Final Clinical Impressions(s) / UC Diagnoses   Final diagnoses:  Dental abscess   Discharge Instructions   None    ED Prescriptions     Medication Sig Dispense Auth. Provider   amoxicillin (AMOXIL) 500 MG capsule Take 1 capsule (500 mg total) by mouth 3 (three) times daily. 21 capsule Francene Finders, PA-C      PDMP not reviewed this encounter.   Francene Finders, PA-C 06/07/22 1657

## 2022-09-07 ENCOUNTER — Other Ambulatory Visit: Payer: Self-pay

## 2022-09-07 ENCOUNTER — Emergency Department (HOSPITAL_COMMUNITY)
Admission: EM | Admit: 2022-09-07 | Discharge: 2022-09-07 | Discharge status: Against Medical Advice | Payer: Medicaid Other | Attending: Emergency Medicine | Admitting: Emergency Medicine

## 2022-09-07 ENCOUNTER — Encounter (HOSPITAL_COMMUNITY): Payer: Self-pay

## 2022-09-07 DIAGNOSIS — L02414 Cutaneous abscess of left upper limb: Secondary | ICD-10-CM | POA: Diagnosis not present

## 2022-09-07 DIAGNOSIS — L02413 Cutaneous abscess of right upper limb: Secondary | ICD-10-CM | POA: Insufficient documentation

## 2022-09-07 DIAGNOSIS — R1110 Vomiting, unspecified: Secondary | ICD-10-CM | POA: Diagnosis not present

## 2022-09-07 DIAGNOSIS — R531 Weakness: Secondary | ICD-10-CM | POA: Insufficient documentation

## 2022-09-07 DIAGNOSIS — Z5321 Procedure and treatment not carried out due to patient leaving prior to being seen by health care provider: Secondary | ICD-10-CM | POA: Diagnosis not present

## 2022-09-07 LAB — URINALYSIS, ROUTINE W REFLEX MICROSCOPIC
Bacteria, UA: NONE SEEN
Bilirubin Urine: NEGATIVE
Cellular Cast, UA: 9
Glucose, UA: NEGATIVE mg/dL
Hgb urine dipstick: NEGATIVE
Ketones, ur: NEGATIVE mg/dL
Leukocytes,Ua: NEGATIVE
Nitrite: NEGATIVE
Protein, ur: 30 mg/dL — AB
Specific Gravity, Urine: 1.019 (ref 1.005–1.030)
pH: 5 (ref 5.0–8.0)

## 2022-09-07 MED ORDER — ONDANSETRON 4 MG PO TBDP
4.0000 mg | ORAL_TABLET | Freq: Once | ORAL | Status: AC | PRN
  Administered 2022-09-07: 4 mg via ORAL
  Filled 2022-09-07: qty 1

## 2022-09-07 NOTE — ED Triage Notes (Signed)
Patient reports that she has an abscesses to bilateral arms and redness to the left upper arm. Patient also c/o emesis and weakness x 3 days.  Patient states she last used Heroin IV a week ago.

## 2022-09-07 NOTE — ED Notes (Signed)
Pt request to wait on blood work incase IV is needed.

## 2023-01-31 ENCOUNTER — Other Ambulatory Visit: Payer: Self-pay | Admitting: Internal Medicine
